# Patient Record
Sex: Male | Born: 1985 | Race: Black or African American | Hispanic: No | Marital: Single | Smoking: Current every day smoker
Health system: Southern US, Community
[De-identification: ages and names within clinical notes are randomized; demographics above are authoritative.]

## PROBLEM LIST (undated history)

## (undated) HISTORY — PX: HERNIA REPAIR: SHX51

---

## 2016-04-17 ENCOUNTER — Encounter (HOSPITAL_COMMUNITY): Payer: Self-pay

## 2016-04-17 ENCOUNTER — Emergency Department (HOSPITAL_COMMUNITY)
Admission: EM | Admit: 2016-04-17 | Discharge: 2016-04-17 | Disposition: A | Discharge status: Home / Self Care | Payer: Self-pay | Attending: Emergency Medicine | Admitting: Emergency Medicine

## 2016-04-17 ENCOUNTER — Emergency Department (HOSPITAL_COMMUNITY): Payer: Self-pay

## 2016-04-17 DIAGNOSIS — Z113 Encounter for screening for infections with a predominantly sexual mode of transmission: Secondary | ICD-10-CM

## 2016-04-17 DIAGNOSIS — F17200 Nicotine dependence, unspecified, uncomplicated: Secondary | ICD-10-CM | POA: Insufficient documentation

## 2016-04-17 DIAGNOSIS — J069 Acute upper respiratory infection, unspecified: Secondary | ICD-10-CM | POA: Insufficient documentation

## 2016-04-17 DIAGNOSIS — A64 Unspecified sexually transmitted disease: Secondary | ICD-10-CM | POA: Insufficient documentation

## 2016-04-17 LAB — URINALYSIS, ROUTINE W REFLEX MICROSCOPIC
BILIRUBIN URINE: NEGATIVE
GLUCOSE, UA: NEGATIVE mg/dL
HGB URINE DIPSTICK: NEGATIVE
Ketones, ur: NEGATIVE mg/dL
Leukocytes, UA: NEGATIVE
Nitrite: NEGATIVE
PROTEIN: NEGATIVE mg/dL
Specific Gravity, Urine: 1.015 (ref 1.005–1.030)
pH: 6 (ref 5.0–8.0)

## 2016-04-17 MED ORDER — LIDOCAINE HCL (PF) 1 % IJ SOLN
INTRAMUSCULAR | Status: AC
  Filled 2016-04-17: qty 5

## 2016-04-17 MED ORDER — BENZONATATE 100 MG PO CAPS: 100.0000 mg | ORAL_CAPSULE | Freq: Three times a day (TID) | ORAL | 0 refills | Status: AC | PRN

## 2016-04-17 MED ORDER — CEFTRIAXONE SODIUM 250 MG IJ SOLR
250.0000 mg | Freq: Once | INTRAMUSCULAR | Status: AC
  Administered 2016-04-17: 250 mg via INTRAMUSCULAR
  Filled 2016-04-17: qty 250

## 2016-04-17 MED ORDER — AZITHROMYCIN 250 MG PO TABS
1000.0000 mg | ORAL_TABLET | Freq: Once | ORAL | Status: AC
  Administered 2016-04-17: 1000 mg via ORAL
  Filled 2016-04-17: qty 4

## 2016-04-17 NOTE — ED Triage Notes (Signed)
Pt c/o of progressively worsening nonproductive cough times one week, states he was having green mucous but that stopped a couple of days ago. Over the last couple of days he has became SOB with

## 2016-04-17 NOTE — Discharge Instructions (Signed)
To find a primary care or specialty doctor please call [phone] or [phone] to access "[facility] Find a Doctor Service." ° °You may also go on the [facility] website at [email or web address] ° °There are also multiple [facility], [facility] and [facility] practices throughout the [place] that are frequently accepting new patients. You may find a clinic that is close to your home and contact them. ° °[facility] -  °[address] °[place]  [address] °[phone] ° °[facility] in [place] (also locations in [place] and [place]) -  °[address] °[place] [address] [phone] ° °[facility] -  °[address] °[place] [address] [phone] ° ° °

## 2016-04-17 NOTE — ED Notes (Signed)
Pt also states that it "doesnt feel right" when using the restroom recently, denies burning or being able to describe the feeling other than "not feeling normal".

## 2016-04-17 NOTE — ED Provider Notes (Signed)
TIME SEEN: 1:20 AM  CHIEF COMPLAINT: Cough, dysuria  HPI: Pt is a 30 y.o. male with no significant past history who presents to the emergency department with one week of cough. Reports that it started off as productive with yellow sputum and is now dry. No fevers, chills. States he feels like he is short of breath. No chest pain.  No history of PE, DVT, exogenous estrogen use, fracture, surgery, trauma, hospitalization, prolonged travel. No lower extremity swelling or pain. No calf tenderness.  No history of asthma or COPD.   Patient also complains of discomfort with urination. No abdominal pain, vomiting or diarrhea. No flank pain. No penile discharge, testicular pain or swelling. States he has had history of previous sexual transmitted diseases and has been sexually active using protection only intermittently. When asked if he is concerned about STDs he states, "it is possible". Reports he had a negative HIV and syphilis tests 3 months ago [facility]. Is requesting treatment for gonorrhea and chlamydia today as he states "I'm about to go out of town".  ROS: See HPI Constitutional: no fever  Eyes: no drainage  ENT: no runny nose   Cardiovascular:  no chest pain  Resp:  SOB  GI: no vomiting GU: no dysuria Integumentary: no rash  Allergy: no hives  Musculoskeletal: no leg swelling  Neurological: no slurred speech ROS otherwise negative  PAST MEDICAL HISTORY/PAST SURGICAL HISTORY:  No past medical history on file.  MEDICATIONS:  Prior to Admission medications   Not on File    ALLERGIES:  No Known Allergies  SOCIAL HISTORY:  Social History  Substance Use Topics  . Smoking status: Current Every Day Smoker  . Smokeless tobacco: Not on file  . Alcohol use Not on file    FAMILY HISTORY: No family history on file.  EXAM: BP 146/81 (BP Location: Left Arm)   Pulse 66   Temp 98.3 F (36.8 C) (Oral)   Resp 20   Ht 5\' 9"  (1.753 m)   Wt 230 lb (104.3 kg)   SpO2  98%   BMI 33.97 kg/m  CONSTITUTIONAL: Alert and oriented and responds appropriately to questions. Well-appearing; well-nourished HEAD: Normocephalic EYES: Conjunctivae clear, PERRL ENT: normal nose; no rhinorrhea; moist mucous membranes NECK: Supple, no meningismus, no LAD  CARD: RRR; S1 and S2 appreciated; no murmurs, no clicks, no rubs, no gallops RESP: Normal chest excursion without splinting or tachypnea; breath sounds clear and equal bilaterally; no wheezes, no rhonchi, no rales, no hypoxia or respiratory distress, speaking full sentences ABD/GI: Normal bowel sounds; non-distended; soft, non-tender, no rebound, no guarding, no peritoneal signs BACK:  The back appears normal and is non-tender to palpation, there is no CVA tenderness EXT: Normal ROM in all joints; non-tender to palpation; no edema; normal capillary refill; no cyanosis, no calf tenderness or swelling    SKIN: Normal color for age and race; warm; no rash NEURO: Moves all extremities equally, sensation to light touch intact diffusely, cranial nerves II through XII intact PSYCH: The patient's mood and manner are appropriate. Grooming and personal hygiene are appropriate.  MEDICAL DECISION MAKING: Patient here for complaints of cough. Lungs are clear to auscultation. No respiratory distress, increased work of breathing or hypoxia. Chest x-ray shows no acute abnormality. Suspect viral illness. I do not feel he needs antibiotics at this time. We'll discharge with [employer].  Patient also complaining of discomfort when he urinates. No other associated symptoms. Urinalysis shows no sign of infection. Urine gonorrhea  and chlamydia is pending but we have treated him empirically given his possible exposure. Recent negative syphilis and HIV testing per his report. Have recommended close outpatient follow-up with health department for regular STD screening including HIV test in another 3 months. Recommended he use protection at all  times with sexual intercourse. Given ceftriaxone and azithromycin today.  At this time, I do not feel there is any life-threatening condition present. I have reviewed and discussed all results (EKG, imaging, lab, urine as appropriate), exam findings with patient/family. I have reviewed nursing notes and appropriate previous records.  I feel the patient is safe to be discharged home without further emergent workup and can continue workup as an outpatient as needed. Discussed usual and customary return precautions. Patient/family verbalize understanding and are comfortable with this plan.  Outpatient follow-up has been provided. All questions have been answered.      Layla MawKristen N Jendaya Gossett, DO 04/17/16 [phone]

## 2016-04-20 LAB — GC/CHLAMYDIA PROBE AMP (~~LOC~~) NOT AT ARMC
Chlamydia: NEGATIVE
Neisseria Gonorrhea: NEGATIVE

## 2016-09-15 ENCOUNTER — Encounter (HOSPITAL_COMMUNITY): Payer: Self-pay | Admitting: Emergency Medicine

## 2016-09-15 DIAGNOSIS — F17200 Nicotine dependence, unspecified, uncomplicated: Secondary | ICD-10-CM | POA: Insufficient documentation

## 2016-09-15 DIAGNOSIS — Z79899 Other long term (current) drug therapy: Secondary | ICD-10-CM | POA: Insufficient documentation

## 2016-09-15 DIAGNOSIS — R369 Urethral discharge, unspecified: Secondary | ICD-10-CM | POA: Insufficient documentation

## 2016-09-15 NOTE — ED Triage Notes (Signed)
Pt states has had unprotected sex and noticed yellow discharge from pennis today.

## 2016-09-16 ENCOUNTER — Emergency Department (HOSPITAL_COMMUNITY)
Admission: EM | Admit: 2016-09-16 | Discharge: 2016-09-16 | Disposition: A | Discharge status: Home / Self Care | Payer: Self-pay | Attending: Emergency Medicine | Admitting: Emergency Medicine

## 2016-09-16 DIAGNOSIS — R369 Urethral discharge, unspecified: Secondary | ICD-10-CM

## 2016-09-16 LAB — URINALYSIS, ROUTINE W REFLEX MICROSCOPIC
Bilirubin Urine: NEGATIVE
GLUCOSE, UA: NEGATIVE mg/dL
KETONES UR: NEGATIVE mg/dL
NITRITE: NEGATIVE
PROTEIN: NEGATIVE mg/dL
Specific Gravity, Urine: 1.014 (ref 1.005–1.030)
pH: 6 (ref 5.0–8.0)

## 2016-09-16 MED ORDER — CEFTRIAXONE SODIUM 250 MG IJ SOLR
250.0000 mg | Freq: Once | INTRAMUSCULAR | Status: AC
  Administered 2016-09-16: 250 mg via INTRAMUSCULAR
  Filled 2016-09-16: qty 250

## 2016-09-16 MED ORDER — AZITHROMYCIN 250 MG PO TABS
1000.0000 mg | ORAL_TABLET | Freq: Once | ORAL | Status: AC
  Administered 2016-09-16: 1000 mg via ORAL
  Filled 2016-09-16: qty 4

## 2016-09-16 MED ORDER — LIDOCAINE HCL (PF) 1 % IJ SOLN
INTRAMUSCULAR | Status: AC
  Administered 2016-09-16: 0.9 mL
  Filled 2016-09-16: qty 5

## 2016-09-16 NOTE — ED Provider Notes (Signed)
[facility]-EM[facility]ENCY DEPT Provider Note   CSN: [id number] Arrival date & time: 09/15/16  2333     History   Chief Complaint Chief Complaint  Patient presents with  . Penile Discharge    HPI Cory Buck is a 31 y.o. male.  HPI   Cory Buck is a [age] y.o. male who presents to the Emergency Department complaining of yellow penile discharge for 2 hrs.  He states that he had unprotected intercourse about one week ago with a new partner.  He also complains of burning with urination and states symptoms are similar to previous STD.  He denies fever, abdominal or back pain, fever, vomiting, genital lesions or rash  History reviewed. No pertinent past medical history.  There are no active problems to display for this patient.   Past Surgical History:  Procedure Laterality Date  . HERNIA REPAIR         Home Medications    Prior to Admission medications   Medication Sig Start Date End Date Taking? Authorizing Provider  benzonatate (TESSALON) 100 MG capsule Take 1 capsule (100 mg total) by mouth 3 (three) times daily as needed for cough. 04/17/16   Layla MawKristen N Ward, DO    Family History History reviewed. No pertinent family history.  Social History Social History  Substance Use Topics  . Smoking status: Current Every Day Smoker  . Smokeless tobacco: Never Used  . Alcohol use Yes     Comment: weekends     Allergies   Patient has no known allergies.   Review of Systems Review of Systems  Constitutional: Negative for activity change, appetite change and fever.  HENT: Negative for sore throat.   Respiratory: Negative for shortness of breath.   Gastrointestinal: Negative for abdominal pain, nausea and rectal pain.  Genitourinary: Positive for discharge and dysuria. Negative for decreased urine volume, genital sores, hematuria, penile pain, penile swelling, scrotal swelling and testicular pain.  Musculoskeletal: Negative for back pain.  Neurological: Negative for weakness.      Physical Exam Updated Vital Signs BP 137/83 (BP Location: Right Arm)   Pulse 81   Temp 98.4 F (36.9 C) (Oral)   Resp 17   Ht 5\' 9"  (1.753 m)   Wt 99.8 kg   SpO2 98%   BMI 32.49 kg/m   Physical Exam  Constitutional: He is oriented to person, place, and time. He appears well-developed and well-nourished. No distress.  HENT:  Head: Atraumatic.  Mouth/Throat: Oropharynx is clear and moist.  Neck: Normal range of motion.  Cardiovascular: Normal rate and regular rhythm.   No murmur heard. Pulmonary/Chest: Effort normal and breath sounds normal. No respiratory distress.  Abdominal: Soft. He exhibits no distension. There is no tenderness. There is no guarding. Hernia confirmed negative in the right inguinal area and confirmed negative in the left inguinal area.  Genitourinary: Testes normal. Cremasteric reflex is present. Right testis shows no mass and no tenderness. Left testis shows no mass and no tenderness. Circumcised. No penile erythema or penile tenderness. Discharge found.  Genitourinary Comments: Exam chaperoned by nursing.  Yellow penile d/c present. No genital lesions.     Lymphadenopathy: No inguinal adenopathy noted on the right or left side.  Neurological: He is alert and oriented to person, place, and time.  Skin: Skin is warm. No rash noted.  Nursing note and vitals reviewed.    ED Treatments / Results  Labs (all labs ordered are listed, but only abnormal results are displayed) Labs Reviewed  URINALYSIS, ROUTINE W  REFLEX MICROSCOPIC - Abnormal; Notable for the following:       Result Value   APPearance HAZY (*)    Hgb urine dipstick SMALL (*)    Leukocytes, UA LARGE (*)    Bacteria, UA FEW (*)    All other components within normal limits  GC/CHLAMYDIA PROBE AMP ([place]) NOT AT [facility]    EKG  EKG Interpretation None       Radiology No results found.  Procedures Procedures (including critical care time)  Medications Ordered in  ED Medications  azithromycin (ZITHROMAX) tablet 1,000 mg (not administered)  cefTRIAXone (ROCEPHIN) injection 250 mg (not administered)     Initial Impression / Assessment and Plan / ED Course  I have reviewed the triage vital signs and the nursing notes.  Pertinent labs & imaging results that were available during my care of the patient were reviewed by me and considered in my medical decision making (see chart for details).     Pt well appearing.  Will tx with IM rocephin and po zithromax.  Cultures pending.  Sexual partner also here tonight requesting testing.  Counseled on safe sex practices.    Final Clinical Impressions(s) / ED Diagnoses   Final diagnoses:  Penile discharge    New Prescriptions New Prescriptions   No medications on file     Pauline Aus, PA-C 09/16/16 0147    Devoria Albe, MD 09/16/16 [phone]

## 2016-09-16 NOTE — Discharge Instructions (Signed)
Follow-up with the health dept if needed

## 2016-09-17 LAB — GC/CHLAMYDIA PROBE AMP (~~LOC~~) NOT AT ARMC
CHLAMYDIA, DNA PROBE: NEGATIVE
NEISSERIA GONORRHEA: POSITIVE — AB

## 2017-11-28 IMAGING — DX DG CHEST 2V
2 series · 2 of 2 positions shown · non-contrast
Comparison: None.

CLINICAL DATA: Cough, onset yesterday.

EXAM:
CHEST  2 VIEW

[chest pa]
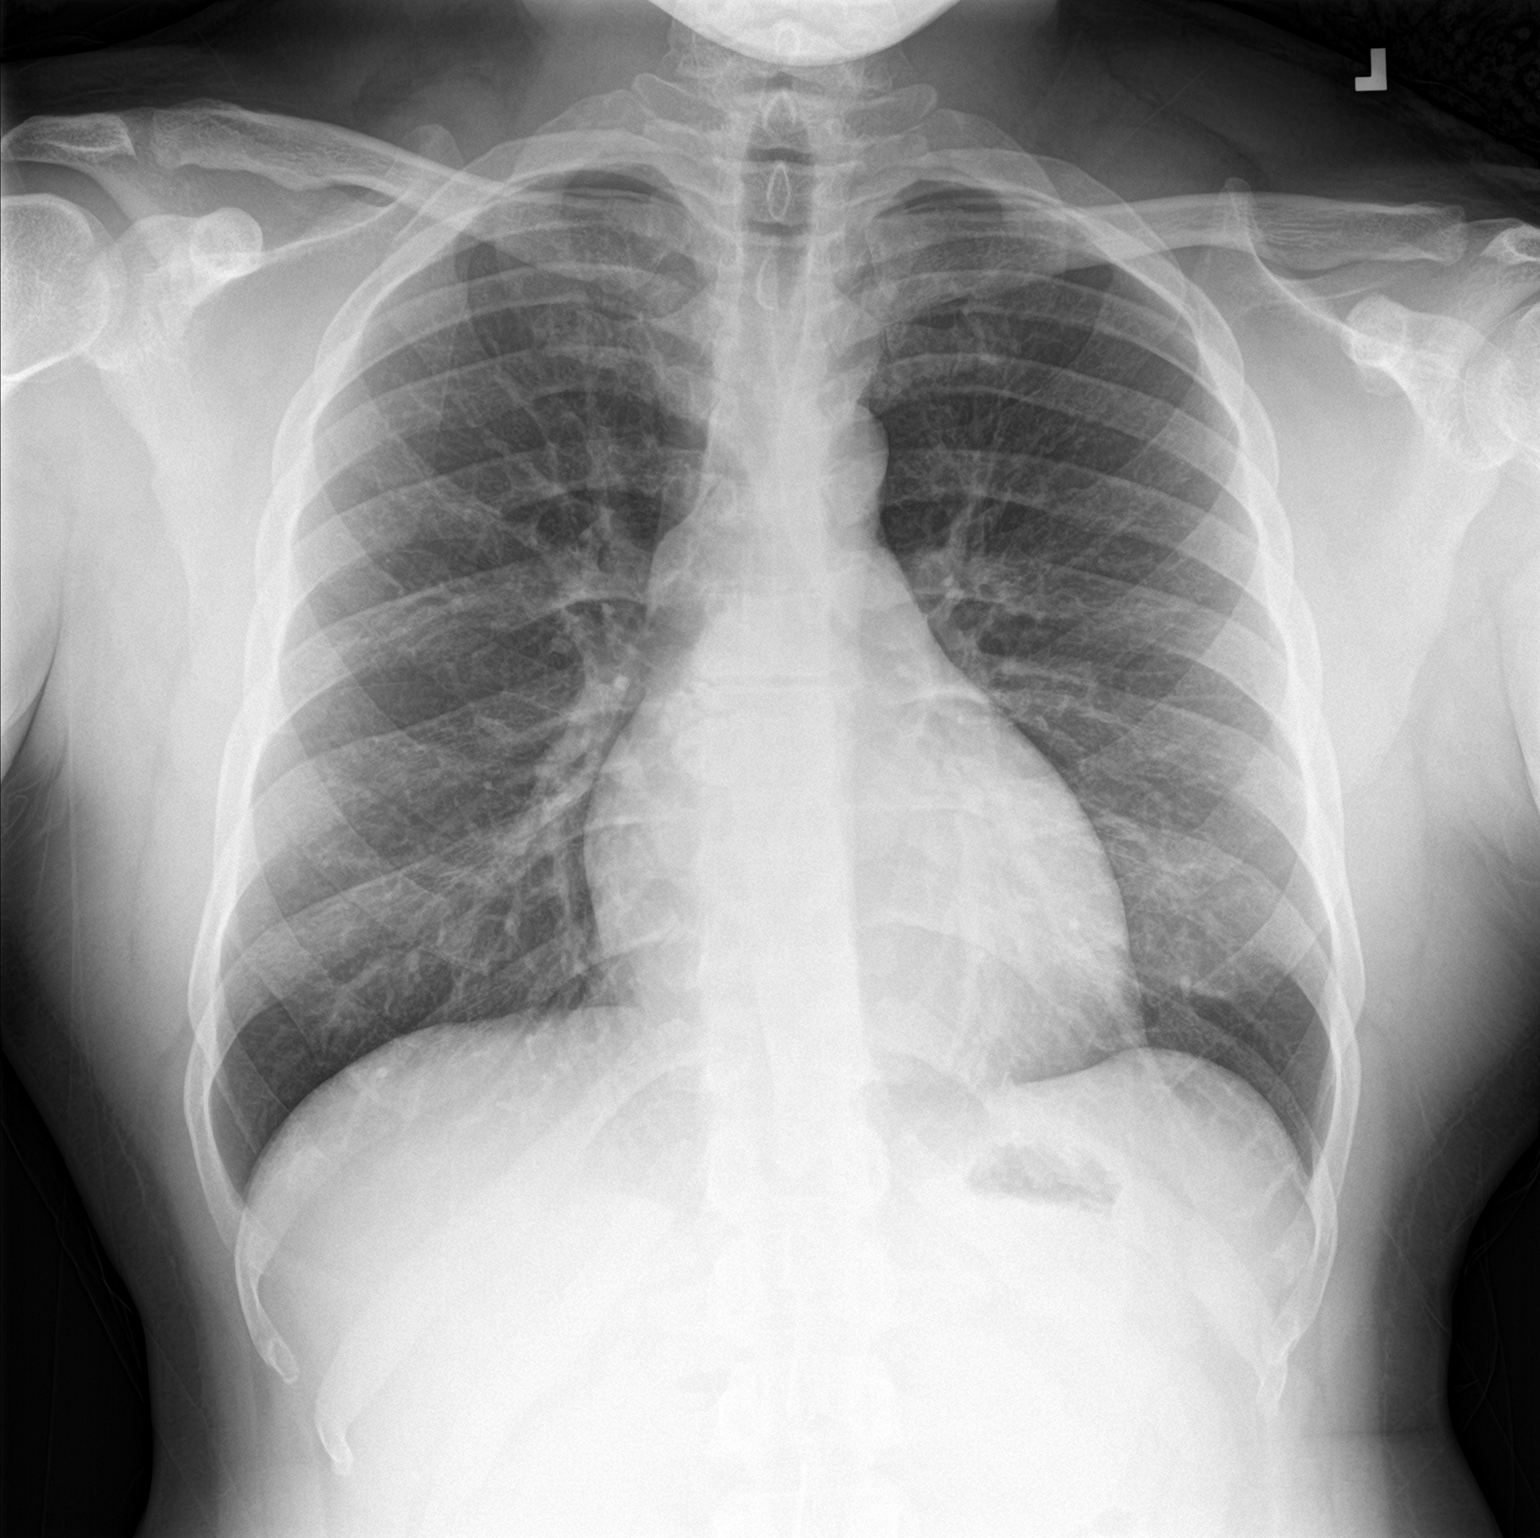

[chest lat]
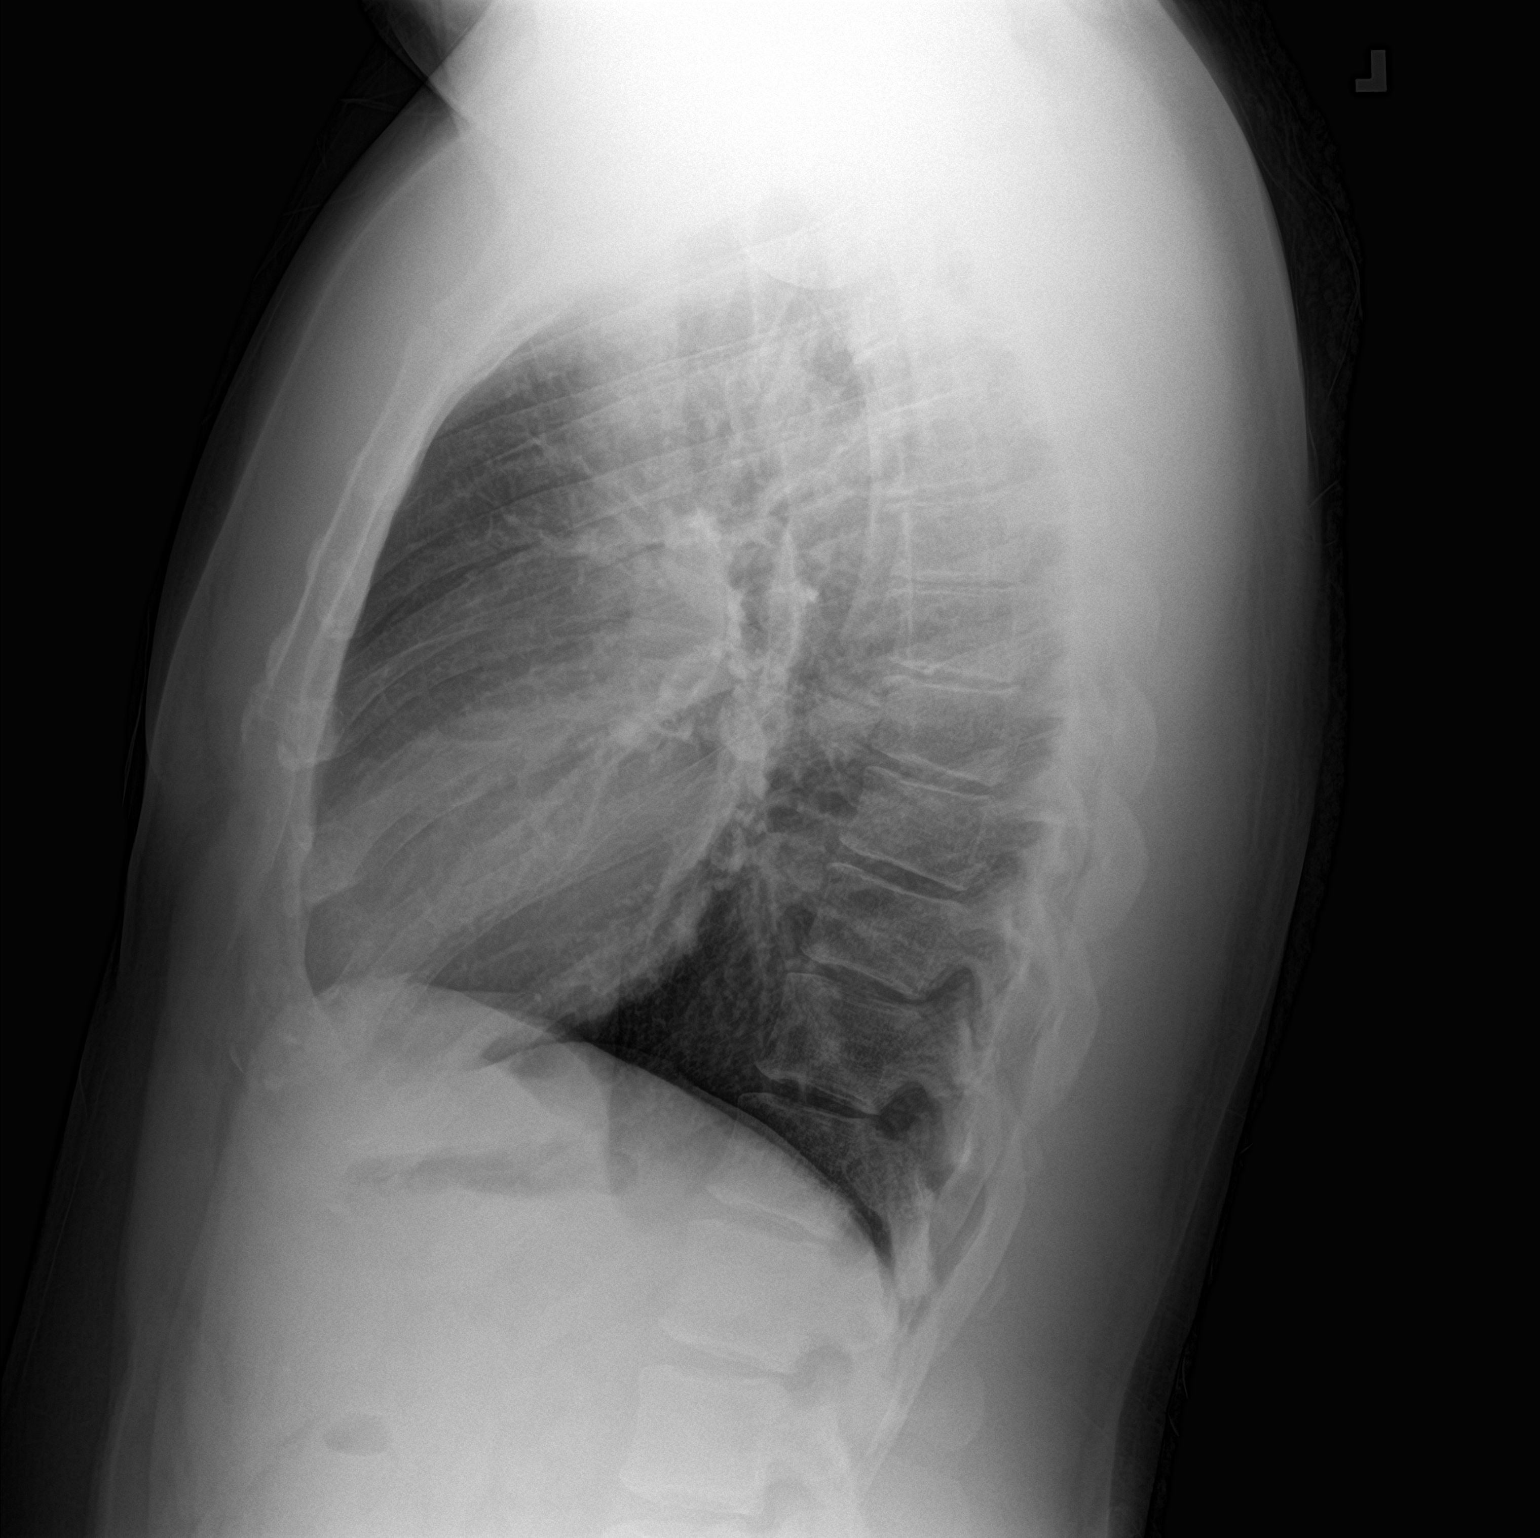

[2 of 2 positions shown; findings below may reference images not displayed]

FINDINGS: The cardiomediastinal contours are normal. Borderline hyperinflation
and bronchitic change. Pulmonary vasculature is normal. No
consolidation, pleural effusion, or pneumothorax. No acute osseous
abnormalities are seen.
IMPRESSION: Borderline hyperinflation and bronchitic change, may be acute or
chronic.
